# Patient Record
Sex: Female | Born: 2000
Health system: Southern US, Community
[De-identification: ages and names within clinical notes are randomized; demographics above are authoritative.]

## PROBLEM LIST (undated history)

## (undated) DIAGNOSIS — L309 Dermatitis, unspecified: Secondary | ICD-10-CM

## (undated) HISTORY — DX: Dermatitis, unspecified: L30.9

---
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
## 2020-12-22 NOTE — Progress Notes (Signed)
Follow Up Note  RE: Ann Thomas MRN: [id number] DOB: 08-20-2001 Date of Office Visit: 12/23/2020  Referring provider: Berline Lopes, MD Primary care provider: Johny Blamer, MD  Chief Complaint: Allergic Rhinitis  (Needs refill on Auvi-Q)  History of Present Illness: I had the pleasure of seeing Ann Thomas for a follow up visit at [facility] on 12/23/2020. She is a 20 y.o. female, who is being followed for food allergy. Her previous allergy office visit was on 07/30/2018 with Dr. Lucie Leather. Today is a regular follow up visit. She is accompanied today by her mother who provided/contributed to the history.   Food allergy Currently avoiding peanuts and tree nuts.  Patient had cross-contamination about 2 months ago with some type of nuts at her college's cafeteria. She is not sure what the food was cross contaminated though.   She had some type of Asian noodles and immediately started noticing throat and lip swelling. She took benadryl at her dorm and symptoms improved by the time she went to the ER which took about 20 minutes. She was monitored in triage and send home.  She is doing to study abroad in [place] and her host family is aware of her allergies.   Component     Latest Ref Rng & Units 07/30/2018  Class Description Allergens      Comment  Peanut IgE     Class III kU/L 1.55 (A)  Hazelnut ([place]) IgE     Class 0/I kU/L 0.21 (A)  [place] Nut IgE     Class 0 kU/L <0.10  F020-IgE Almond     Class 0 kU/L <0.10  Pecan Nut IgE     Class I kU/L 0.47 (A)  F202-IgE Cashew Nut     Class III kU/L 2.05 (A)  Walnut IgE     Class III kU/L 2.44 (A)   2019 skin testing: Foods    1. Peanut 4+        10. Cashew 4+        11. Pecan Food 4+        12. Walnut Food 4+        13. Almond 3+        14. Hazelnut Negative        15. [place] nut 3+        17. Pistachio 4+         Assessment and Plan: [name] is a 20 y.o. female with: Allergy with anaphylaxis due  to food, subsequent encounter Past history - 2019 skin testing positive to peanuts, tree nuts; 2019 bloodwork positive to peanuts and tree nuts. Interim history - one episode of cross contamination recently with lip/throat swelling. Treated with benadryl only. Going to study abroad in a few weeks.  Continue strict avoidance of peanuts and tree nuts.   Refilled AuviQ and demonstrated proper use.   For mild symptoms you can take over the counter antihistamines such as Benadryl and monitor symptoms closely. If symptoms worsen or if you have severe symptoms including breathing issues, throat closure, significant swelling, whole body hives, severe diarrhea and vomiting, lightheadedness then inject epinephrine and seek immediate medical care afterwards.  Food action plan given.    Make sure when you are traveling abroad to ask about peanuts/tree nuts in the foods - labeling may not be as good as in the [place].  Make sure to have your medications with you at all times - in your carry on bags.  Marland Kitchen  Get bloodwork for peanut/nut panel with component panel as below.  Return in about 1 year (around 12/23/2021).  Meds ordered this encounter  Medications  . EPINEPHrine (AUVI-Q) 0.3 mg/0.3 mL IJ SOAJ injection    Sig: Inject 0.3 mg into the muscle as needed for anaphylaxis.    Dispense:  4 each    Refill:  2    Lab Orders     IgE Nut Prof. w/Component Rflx  Diagnostics: None.  Medication List:  Current Outpatient Medications  Medication Sig Dispense Refill  . EPINEPHrine (AUVI-Q) 0.3 mg/0.3 mL IJ SOAJ injection Inject 0.3 mg into the muscle as needed for anaphylaxis. 4 each 2  . ibuprofen (ADVIL,MOTRIN) 200 MG tablet Take 200-400 mg by mouth every 6 (six) hours as needed for headache or moderate pain.    . diphenhydrAMINE (BENADRYL) 25 MG tablet Take 1 tablet (25 mg total) by mouth every 6 (six) hours as needed for itching. 30 tablet 0  . ranitidine (ZANTAC) 150 MG capsule Take 1 capsule (150 mg  total) by mouth 2 (two) times daily. 10 capsule 0   No current facility-administered medications for this visit.   Allergies: Allergies  Allergen Reactions  . Other Anaphylaxis and Other (See Comments)  . Peanut-Containing Drug Products Anaphylaxis    Allergic reaction to almost all kinds of nuts   I reviewed her past medical history, social history, family history, and environmental history and no significant changes have been reported from her previous visit.  Review of Systems  Constitutional: Negative for appetite change, chills, fever and unexpected weight change.  HENT: Negative for congestion and rhinorrhea.   Eyes: Negative for itching.  Respiratory: Negative for cough, chest tightness, shortness of breath and wheezing.   Gastrointestinal: Negative for abdominal pain.  Skin: Negative for rash.  Allergic/Immunologic: Positive for food allergies.  Neurological: Negative for headaches.   Objective: BP (!) 110/58 (BP Location: Right Arm, Patient Position: Sitting, Cuff Size: Normal)   Pulse 88   Ht 5' 2.52" (1.588 m)   Wt 103 lb (46.7 kg)   BMI 18.53 kg/m  Body mass index is 18.53 kg/m. Physical Exam Vitals and nursing note reviewed.  Constitutional:      Appearance: Normal appearance. She is well-developed.  HENT:     Head: Normocephalic and atraumatic.     Right Ear: External ear normal.     Left Ear: External ear normal.     Nose: Nose normal.     Mouth/Throat:     Mouth: Mucous membranes are moist.     Pharynx: Oropharynx is clear.  Eyes:     Conjunctiva/sclera: Conjunctivae normal.  Cardiovascular:     Rate and Rhythm: Normal rate and regular rhythm.     Heart sounds: Normal heart sounds. No murmur heard.   Pulmonary:     Effort: Pulmonary effort is normal.     Breath sounds: Normal breath sounds. No wheezing, rhonchi or rales.  Musculoskeletal:     Cervical back: Neck supple.  Skin:    General: Skin is warm.     Findings: No rash.  Neurological:      Mental Status: She is alert and oriented to person, place, and time.  Psychiatric:        Behavior: Behavior normal.    Previous notes and tests were reviewed. The plan was reviewed with the patient/family, and all questions/concerned were addressed.  It was my pleasure to see Rox today and participate in her care. Please feel free to contact  me with any questions or concerns.  Sincerely,  Rexene Alberts, DO Allergy & Immunology  [facility] [place] office: [place] office: [phone]

## 2020-12-23 ENCOUNTER — Other Ambulatory Visit: Payer: Self-pay

## 2020-12-23 ENCOUNTER — Ambulatory Visit (INDEPENDENT_AMBULATORY_CARE_PROVIDER_SITE_OTHER): Payer: 59 | Admitting: Allergy

## 2020-12-23 ENCOUNTER — Encounter: Payer: Self-pay | Admitting: Allergy

## 2020-12-23 VITALS — BP 110/58 | HR 88 | Ht 62.52 in | Wt 103.0 lb

## 2020-12-23 DIAGNOSIS — T7800XD Anaphylactic reaction due to unspecified food, subsequent encounter: Secondary | ICD-10-CM | POA: Diagnosis not present

## 2020-12-23 DIAGNOSIS — T781XXD Other adverse food reactions, not elsewhere classified, subsequent encounter: Secondary | ICD-10-CM | POA: Insufficient documentation

## 2020-12-23 MED ORDER — EPINEPHRINE 0.3 MG/0.3ML IJ SOAJ: 0.3000 mg | INTRAMUSCULAR | 2 refills | Status: AC | PRN

## 2020-12-23 NOTE — Assessment & Plan Note (Signed)
Past history - 2019 skin testing positive to peanuts, tree nuts; 2019 bloodwork positive to peanuts and tree nuts. Interim history - one episode of cross contamination recently with lip/throat swelling. Treated with benadryl only. Going to study abroad in a few weeks.  Continue strict avoidance of peanuts and tree nuts.   Refilled AuviQ and demonstrated proper use.   For mild symptoms you can take over the counter antihistamines such as Benadryl and monitor symptoms closely. If symptoms worsen or if you have severe symptoms including breathing issues, throat closure, significant swelling, whole body hives, severe diarrhea and vomiting, lightheadedness then inject epinephrine and seek immediate medical care afterwards.  Food action plan given.    Make sure when you are traveling abroad to ask about peanuts/tree nuts in the foods - labeling may not be as good as in the [place].  Make sure to have your medications with you at all times - in your carry on bags.  . Get bloodwork for peanut/nut panel with component panel as below.

## 2020-12-23 NOTE — Patient Instructions (Addendum)
Food allergy:  Continue strict avoidance of peanuts and tree nuts.   For mild symptoms you can take over the counter antihistamines such as Benadryl and monitor symptoms closely. If symptoms worsen or if you have severe symptoms including breathing issues, throat closure, significant swelling, whole body hives, severe diarrhea and vomiting, lightheadedness then inject epinephrine and seek immediate medical care afterwards.  Food action plan in place.   Make sure when you are traveling abroad to ask about peanuts/tree nuts in the foods - labeling may not be as good as in the [place].  Make sure to have your medications with you at all times - in your carry on bags.   . Get bloodwork:  o We are ordering labs, so please allow 1-2 weeks for the results to come back. o With the newly implemented Cures Act, the labs might be visible to you at the same time that they become visible to me. However, I will not address the results until all of the results are back, so please be patient.  o In the meantime, continue recommendations in your patient instructions, including avoidance measures (if applicable), until you hear from me.  Follow up in 1 year.  Sign up for mychart.

## 2020-12-27 LAB — PANEL 604726
Cor A 1 IgE: <0.1 kU/L
Cor A 14 IgE: <0.1 kU/L
Cor A 8 IgE: <0.1 kU/L
Cor A 9 IgE: <0.1 kU/L

## 2020-12-27 LAB — PEANUT COMPONENTS
F352-IgE Ara h 8: <0.1 kU/L
F422-IgE Ara h 1: <0.1 kU/L
F423-IgE Ara h 2: 9.27 kU/L — AB
F424-IgE Ara h 3: <0.1 kU/L
F427-IgE Ara h 9: <0.1 kU/L
F447-IgE Ara h 6: 6.48 kU/L — AB

## 2020-12-27 LAB — IGE NUT PROF. W/COMPONENT RFLX
F017-IgE Hazelnut (Filbert): 0.13 kU/L — AB
F018-IgE Brazil Nut: <0.1 kU/L
F020-IgE Almond: <0.1 kU/L
F202-IgE Cashew Nut: 2.94 kU/L — AB
F203-IgE Pistachio Nut: 3.65 kU/L — AB
F256-IgE Walnut: 3.11 kU/L — AB
Macadamia Nut, IgE: 0.37 kU/L — AB
Peanut, IgE: 12.7 kU/L — AB
Pecan Nut IgE: 1.81 kU/L — AB

## 2020-12-27 LAB — PANEL 604721
Jug R 1 IgE: 1.23 kU/L — AB
Jug R 3 IgE: 0.12 kU/L — AB

## 2020-12-27 LAB — ALLERGEN COMPONENT COMMENTS

## 2020-12-27 LAB — PANEL 604239: ANA O 3 IgE: 3.23 kU/L — AB

## 2020-12-29 ENCOUNTER — Telehealth: Payer: Self-pay | Admitting: Allergy

## 2020-12-29 NOTE — Telephone Encounter (Signed)
Is this a medication you would like for the patient to have? 

## 2020-12-29 NOTE — Telephone Encounter (Signed)
Patient called and would like for Dr. Selena Batten to send in a rx Trianolone Acctonide. She had it with her last allergy dr. Sherri [facility] [place] [phone].

## 2020-12-30 MED ORDER — DESONIDE 0.05 % EX OINT: 1.0000 "application " | TOPICAL_OINTMENT | Freq: Two times a day (BID) | CUTANEOUS | 2 refills | Status: AC | PRN

## 2020-12-30 MED ORDER — TRIAMCINOLONE ACETONIDE 0.1 % EX OINT: 1.0000 "application " | TOPICAL_OINTMENT | Freq: Two times a day (BID) | CUTANEOUS | 2 refills | Status: AC | PRN

## 2020-12-30 NOTE — Telephone Encounter (Signed)
Left a detail message letting patient know prescriptions went in and patient should use the medications as directed in the proper locations as prescribed.

## 2020-12-30 NOTE — Telephone Encounter (Signed)
The Rx was an ointment base used for Eczema. Dr. Eileen Stanford was the original prescribe by them. Patient states when it is to hot she gets eczema, face, behind legs, arms crease. She been on this medication since 2015 as a PRN medication. Please advise on this.

## 2020-12-30 NOTE — Telephone Encounter (Signed)
Sent in 2 prescriptions as triamcinolone should not be used on the face.  May use triamcinolone 0.1% ointment twice a day as needed for eczema flares. Do not use on the face, neck, armpits or groin area. Do not use more than 3 weeks in a row.   May use desonide 0.05% ointment twice a day as needed for mild eczema flares - okay to use on the face, neck, groin area. Do not use more than 1 week at a time.

## 2020-12-30 NOTE — Telephone Encounter (Signed)
I don't see it anywhere where she was given this from our office.   Is she using the cream/ointment? Nasal spray? What concentration?   And what is she using it for?

## 2020-12-30 NOTE — Addendum Note (Signed)
Addended by: Ellamae Sia on: 12/30/2020 11:12 AM   Modules accepted: Orders

## 2021-04-26 ENCOUNTER — Ambulatory Visit (INDEPENDENT_AMBULATORY_CARE_PROVIDER_SITE_OTHER): Payer: BLUE CROSS/BLUE SHIELD | Admitting: Otolaryngology

## 2021-04-26 ENCOUNTER — Ambulatory Visit (INDEPENDENT_AMBULATORY_CARE_PROVIDER_SITE_OTHER): Payer: 59 | Admitting: Otolaryngology

## 2021-04-26 ENCOUNTER — Other Ambulatory Visit: Payer: Self-pay

## 2021-04-26 ENCOUNTER — Encounter (INDEPENDENT_AMBULATORY_CARE_PROVIDER_SITE_OTHER): Payer: Self-pay | Admitting: Otolaryngology

## 2021-04-26 VITALS — Temp 97.2°F

## 2021-04-26 DIAGNOSIS — J310 Chronic rhinitis: Secondary | ICD-10-CM | POA: Diagnosis not present

## 2021-04-26 NOTE — Progress Notes (Signed)
HPI: Ann Thomas is a 20 y.o. female who presents is referred by her PCP for evaluation of nasal congestion as well as distorted sense of smell ever since she had COVID almost a year ago.  She recently came back from the [place] region after a 3- 2-month stay over there.  She has noted chronic nasal congestion since she is returned.  She had COVID close to a year ago and lost her sense of smell and taste at that time.  Her taste is back to normal but she still does not smell quite normal yet.  She presents today referred by Dr. Tiburcio Pea for evaluation of nasal congestion.  She is actually breathing a little bit better today.  Past Medical History:  Diagnosis Date  . Eczema    Past Surgical History:  Procedure Laterality Date  . WISDOM TOOTH EXTRACTION  07/16/2018   Social History   Socioeconomic History  . Marital status: Single    Spouse name: Not on file  . Number of children: Not on file  . Years of education: Not on file  . Highest education level: Not on file  Occupational History  . Not on file  Tobacco Use  . Smoking status: Never Smoker  . Smokeless tobacco: Never Used  Vaping Use  . Vaping Use: Never used  Substance and Sexual Activity  . Alcohol use: Never  . Drug use: Never  . Sexual activity: Not on file  Other Topics Concern  . Not on file  Social History Narrative  . Not on file   Social Determinants of Health   Financial Resource Strain: Not on file  Food Insecurity: Not on file  Transportation Needs: Not on file  Physical Activity: Not on file  Stress: Not on file  Social Connections: Not on file   No family history on file. Allergies  Allergen Reactions  . Other Anaphylaxis and Other (See Comments)  . Peanut-Containing Drug Products Anaphylaxis    Allergic reaction to almost all kinds of nuts   Prior to Admission medications   Medication Sig Start Date End Date Taking? Authorizing Provider  desonide (DESOWEN) 0.05 % ointment Apply 1  application topically 2 (two) times daily as needed. On eczema flare on the face. Do not use more than 7 days in a row. 12/30/20   Ellamae Sia, DO  diphenhydrAMINE (BENADRYL) 25 MG tablet Take 1 tablet (25 mg total) by mouth every 6 (six) hours as needed for itching. 07/14/17 07/30/18  Shaune Pollack, MD  EPINEPHrine (AUVI-Q) 0.3 mg/0.3 mL IJ SOAJ injection Inject 0.3 mg into the muscle as needed for anaphylaxis. 12/23/20   Ellamae Sia, DO  ibuprofen (ADVIL,MOTRIN) 200 MG tablet Take 200-400 mg by mouth every 6 (six) hours as needed for headache or moderate pain.    [provider]  ranitidine (ZANTAC) 150 MG capsule Take 1 capsule (150 mg total) by mouth 2 (two) times daily. 07/14/17 07/30/18  Shaune Pollack, MD  triamcinolone ointment (KENALOG) 0.1 % Apply 1 application topically 2 (two) times daily as needed. On eczema flares. Do not use on the face, neck, armpits or groin area. Do not use more than 3 weeks in a row. 12/30/20   Ellamae Sia, DO     Positive ROS: Otherwise negative  All other systems have been reviewed and were otherwise negative with the exception of those mentioned in the HPI and as above.  Physical Exam: Constitutional: Alert, well-appearing, no acute distress Ears: External ears without  lesions or tenderness. Ear canals are clear bilaterally with intact, clear TMs bilaterally. Nasal: External nose without lesions. Septum with minimal deformity to the right.  Mild rhinitis.  Nasal endoscopy was performed and on nasal endoscopy the middle meatus regions were clear bilaterally with no evidence of mucopurulent discharge.  No polyps.  The nasopharynx was clear..  Oral: Lips and gums without lesions. Tongue and palate mucosa without lesions. Posterior oropharynx clear.. Neck: No palpable adenopathy or masses Respiratory: Breathing comfortably  Skin: No facial/neck lesions or rash noted.  Procedures  Assessment: Chronic rhinitis with no intranasal abnormalities noted and no  signs of infection.  Plan: Would recommend regular use of nasal steroid spray and prescribed Nasacort 2 sprays each nostril at night as well as use of saline rinses. Discussed with her that there is no evidence of infection or intranasal abnormalities noted on nasal endoscopy. I would expect nasal steroid spray to help improve her nasal congestion if she uses it regularly.   Narda Bonds, MD   CC:

## 2021-04-28 ENCOUNTER — Ambulatory Visit (INDEPENDENT_AMBULATORY_CARE_PROVIDER_SITE_OTHER): Payer: BLUE CROSS/BLUE SHIELD | Admitting: Otolaryngology
